# Patient Record
Sex: Female | Born: 1995 | Race: Black or African American | Hispanic: No | Marital: Single | Smoking: Never smoker
Health system: Southern US, Community
[De-identification: ages and names within clinical notes are randomized; demographics above are authoritative.]

## PROBLEM LIST (undated history)

## (undated) DIAGNOSIS — J45909 Unspecified asthma, uncomplicated: Secondary | ICD-10-CM

---

## 2016-08-07 ENCOUNTER — Emergency Department (HOSPITAL_COMMUNITY)
Admission: EM | Admit: 2016-08-07 | Discharge: 2016-08-07 | Disposition: A | Discharge status: Home / Self Care | Payer: Self-pay | Attending: Emergency Medicine | Admitting: Emergency Medicine

## 2016-08-07 ENCOUNTER — Encounter (HOSPITAL_COMMUNITY): Payer: Self-pay | Admitting: Emergency Medicine

## 2016-08-07 DIAGNOSIS — L0291 Cutaneous abscess, unspecified: Secondary | ICD-10-CM

## 2016-08-07 DIAGNOSIS — L02212 Cutaneous abscess of back [any part, except buttock]: Secondary | ICD-10-CM | POA: Insufficient documentation

## 2016-08-07 MED ORDER — DOXYCYCLINE HYCLATE 100 MG PO TABS
100.0000 mg | ORAL_TABLET | Freq: Once | ORAL | Status: AC
  Administered 2016-08-07: 100 mg via ORAL
  Filled 2016-08-07: qty 1

## 2016-08-07 MED ORDER — DOXYCYCLINE HYCLATE 50 MG PO CAPS: 50.0000 mg | ORAL_CAPSULE | Freq: Two times a day (BID) | ORAL | 0 refills | Status: DC

## 2016-08-07 MED ORDER — LIDOCAINE-EPINEPHRINE (PF) 2 %-1:200000 IJ SOLN
10.0000 mL | Freq: Once | INTRAMUSCULAR | Status: AC
  Administered 2016-08-07: 10 mL via INTRADERMAL
  Filled 2016-08-07: qty 20

## 2016-08-07 MED ORDER — HYDROCODONE-ACETAMINOPHEN 5-325 MG PO TABS
1.0000 | ORAL_TABLET | Freq: Once | ORAL | Status: AC
  Administered 2016-08-07: 1 via ORAL
  Filled 2016-08-07: qty 1

## 2016-08-07 MED ORDER — HYDROCODONE-ACETAMINOPHEN 5-325 MG PO TABS: 1.0000 | ORAL_TABLET | Freq: Four times a day (QID) | ORAL | 0 refills | Status: DC | PRN

## 2016-08-07 NOTE — ED Triage Notes (Signed)
Pt presents with abscess with drainage to LEFT middle back; pt states she took ibuprofen, unspecified antibiotics, and hydrocortisone cream without improvement

## 2016-08-07 NOTE — ED Provider Notes (Signed)
[facility]EM[facility]ENCY DEPT Provider Note   CSN: [id number] Arrival date & time: 08/07/16  0117     History   Chief Complaint Chief Complaint  Patient presents with  . Abscess    HPI Renee Martin is a 21 y.o. female.  HPI Renee Martin is a 21 y.o. female presents to emergency department complaining of an abscess to the back. Patient states she noticed a small bump on her back about a week ago. She states she just went to the beach and while at the beach the bump has gotten much larger and painful. She reports some drainage. She denies any treatment prior to coming in. No history of the same. No fever or chills. No associated symptoms.  History reviewed. No pertinent past medical history.  There are no active problems to display for this patient.   History reviewed. No pertinent surgical history.  OB History    No data available       Home Medications    Prior to Admission medications   Not on File    Family History History reviewed. No pertinent family history.  Social History Social History  Substance Use Topics  . Smoking status: Never Smoker  . Smokeless tobacco: Never Used  . Alcohol use No     Allergies   Patient has no known allergies.   Review of Systems Review of Systems  Constitutional: Negative for chills and fever.  Respiratory: Negative for cough, chest tightness and shortness of breath.   Cardiovascular: Negative for chest pain, palpitations and leg swelling.  Gastrointestinal: Negative for abdominal pain, diarrhea, nausea and vomiting.  Musculoskeletal: Negative for arthralgias, myalgias, neck pain and neck stiffness.  Skin: Positive for wound. Negative for rash.  Neurological: Negative for dizziness, weakness and headaches.  All other systems reviewed and are negative.    Physical Exam Updated Vital Signs BP (!) 145/66 (BP Location: Left Arm)   Pulse 84   Temp 98.9 F (37.2 C) (Oral)   Resp (!) 24   LMP 07/30/2016   SpO2 100%    Physical Exam  Constitutional: She appears well-developed and well-nourished. No distress.  HENT:  Head: Normocephalic.  Eyes: Conjunctivae are normal.  Neck: Neck supple.  Cardiovascular: Normal rate, regular rhythm and normal heart sounds.   Pulmonary/Chest: Effort normal and breath sounds normal. No respiratory distress. She has no wheezes. She has no rales.  Musculoskeletal: She exhibits no edema.  Neurological: She is alert.  Skin: Skin is warm and dry.  4 x 4 centimeters area of induration, fluctuance, erythema to the left flank. No drainage. Tender to palpation.  Psychiatric: She has a normal mood and affect. Her behavior is normal.  Nursing note and vitals reviewed.    ED Treatments / Results  Labs (all labs ordered are listed, but only abnormal results are displayed) Labs Reviewed - No data to display  EKG  EKG Interpretation None       Radiology No results found.  Procedures Procedures (including critical care time) INCISION AND DRAINAGE Performed by: Jaynie CrumbleKIRICHENKO, Justin Meisenheimer A Consent: Verbal consent obtained. Risks and benefits: risks, benefits and alternatives were discussed Type: abscess  Body area: right mid back  Anesthesia: local infiltration  Incision was made with a scalpel.  Local anesthetic: lidocaine 2 % w epinephrine  Anesthetic total: 4 ml  Complexity: complex Blunt dissection to break up loculations  Drainage: purulent  Drainage amount: large  Packing material: 1/4 in iodoform gauze  Patient tolerance: Patient tolerated the procedure well with no  immediate complications.    Medications Ordered in ED Medications  lidocaine-EPINEPHrine (XYLOCAINE W/EPI) 2 %-1:200000 (PF) injection 10 mL (not administered)     Initial Impression / Assessment and Plan / ED Course  I have reviewed the triage vital signs and the nursing notes.  Pertinent labs & imaging results that were available during my care of the patient were reviewed by  me and considered in my medical decision making (see chart for details).    Patient with an abscess to the right flank, incised and drained with large purulent drainage. Will discharge home with antibiotics, will start on doxycycline. Will also give pain medications. Advised to come back or follow-up with her doctor in 2 days for recheck and packing removal. Sooner return precautions if worsening rapidly.  Vitals:   08/07/16 0126 08/07/16 0410 08/07/16 0411  BP: (!) 145/66 131/69   Pulse: 84  70  Resp: (!) 24    Temp: 98.9 F (37.2 C)    TempSrc: Oral    SpO2: 100%  99%     Final Clinical Impressions(s) / ED Diagnoses   Final diagnoses:  Abscess    New Prescriptions Discharge Medication List as of 08/07/2016  3:53 AM    START taking these medications   Details  doxycycline (VIBRAMYCIN) 50 MG capsule Take 1 capsule (50 mg total) by mouth 2 (two) times daily., Starting Mon 08/07/2016, Print    HYDROcodone-acetaminophen [facility]) 5-325 MG tablet Take 1 tablet by mouth every 6 (six) hours as needed., Starting Mon 08/07/2016, Print         Trellis Moment Emden, PA-C 08/07/16 0532    Shon Baton, MD 08/09/16 [phone]

## 2016-08-07 NOTE — Discharge Instructions (Signed)
Warm compresses or bath soaks several times a day. Take doxycycline as prescribed until all gone. Take ibuprofen for pain. Norco for severe pain only. Return in 2 days for recheck and packing removal.

## 2016-09-14 ENCOUNTER — Encounter (HOSPITAL_COMMUNITY): Payer: Self-pay

## 2016-09-14 ENCOUNTER — Emergency Department (HOSPITAL_COMMUNITY)
Admission: EM | Admit: 2016-09-14 | Discharge: 2016-09-14 | Disposition: A | Discharge status: Home / Self Care | Payer: Self-pay | Attending: Emergency Medicine | Admitting: Emergency Medicine

## 2016-09-14 ENCOUNTER — Emergency Department (HOSPITAL_COMMUNITY): Payer: Self-pay

## 2016-09-14 DIAGNOSIS — S62613A Displaced fracture of proximal phalanx of left middle finger, initial encounter for closed fracture: Secondary | ICD-10-CM | POA: Insufficient documentation

## 2016-09-14 DIAGNOSIS — J45909 Unspecified asthma, uncomplicated: Secondary | ICD-10-CM | POA: Insufficient documentation

## 2016-09-14 DIAGNOSIS — W228XXA Striking against or struck by other objects, initial encounter: Secondary | ICD-10-CM | POA: Insufficient documentation

## 2016-09-14 DIAGNOSIS — Y999 Unspecified external cause status: Secondary | ICD-10-CM | POA: Insufficient documentation

## 2016-09-14 DIAGNOSIS — Y9289 Other specified places as the place of occurrence of the external cause: Secondary | ICD-10-CM | POA: Insufficient documentation

## 2016-09-14 DIAGNOSIS — Y9389 Activity, other specified: Secondary | ICD-10-CM | POA: Insufficient documentation

## 2016-09-14 HISTORY — DX: Unspecified asthma, uncomplicated: J45.909

## 2016-09-14 MED ORDER — ACETAMINOPHEN 500 MG PO TABS
1000.0000 mg | ORAL_TABLET | Freq: Once | ORAL | Status: AC
  Administered 2016-09-14: 1000 mg via ORAL
  Filled 2016-09-14: qty 2

## 2016-09-14 NOTE — ED Notes (Signed)
Ortho paged and coming to apply splint.
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
## 2016-09-14 NOTE — Discharge Instructions (Addendum)
As we discussed, your finger is broken and will likely need surgical repair. You need to call the hand doctor tomorrow to arrange for an appointment in his office sometime next week.   You can take Tylenol or Ibuprofen as directed for pain.  Keep the splint on until you are seen by the hand surgeon.   Return to the emergency department for any worsening pain, redness/swelling of the finger, fever or any other worsening or concerning symptoms.   If you do not have a primary care doctor you see regularly, please you the list below. Please call them to arrange for follow-up.    No Primary Care Doctor Call [facility]  [phone] Other agencies that provide inexpensive medical care    [facility]  [phone]    [facility]  [phone]    [facility]  [phone]    [facility]  [phone]    [facility]  [phone]    [facility]  [phone]

## 2016-09-14 NOTE — ED Notes (Signed)
Patient at xray

## 2016-09-14 NOTE — ED Notes (Signed)
Ortho at bedside.

## 2016-09-14 NOTE — Progress Notes (Signed)
Orthopedic Tech Progress Note Patient Details:  Renee Martin 11-22-1995 [id number]  Ortho Devices Type of Ortho Device: Finger splint Ortho Device/Splint Location: lue Ortho Device/Splint Interventions: Application   Renee Martin 09/14/2016, 2:27 PM
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
## 2016-09-14 NOTE — ED Notes (Signed)
EDP at bedside  

## 2016-12-22 ENCOUNTER — Emergency Department (HOSPITAL_COMMUNITY): Admission: EM | Admit: 2016-12-22 | Payer: Self-pay | Source: Home / Self Care

## 2016-12-27 ENCOUNTER — Ambulatory Visit (HOSPITAL_COMMUNITY)
Admission: EM | Admit: 2016-12-27 | Discharge: 2016-12-27 | Disposition: A | Discharge status: Home / Self Care | Payer: Self-pay

## 2018-07-18 ENCOUNTER — Emergency Department (HOSPITAL_COMMUNITY)
Admission: EM | Admit: 2018-07-18 | Discharge: 2018-07-19 | Disposition: A | Discharge status: Home / Self Care | Payer: No Typology Code available for payment source | Attending: Emergency Medicine | Admitting: Emergency Medicine

## 2018-07-18 ENCOUNTER — Emergency Department (HOSPITAL_COMMUNITY): Payer: No Typology Code available for payment source

## 2018-07-18 ENCOUNTER — Other Ambulatory Visit: Payer: Self-pay

## 2018-07-18 ENCOUNTER — Encounter (HOSPITAL_COMMUNITY): Payer: Self-pay | Admitting: *Deleted

## 2018-07-18 DIAGNOSIS — R0789 Other chest pain: Secondary | ICD-10-CM | POA: Insufficient documentation

## 2018-07-18 DIAGNOSIS — S01111A Laceration without foreign body of right eyelid and periocular area, initial encounter: Secondary | ICD-10-CM

## 2018-07-18 DIAGNOSIS — Y939 Activity, unspecified: Secondary | ICD-10-CM | POA: Diagnosis not present

## 2018-07-18 DIAGNOSIS — S060X1A Concussion with loss of consciousness of 30 minutes or less, initial encounter: Secondary | ICD-10-CM | POA: Diagnosis not present

## 2018-07-18 DIAGNOSIS — S0083XA Contusion of other part of head, initial encounter: Secondary | ICD-10-CM

## 2018-07-18 DIAGNOSIS — S0285XA Fracture of orbit, unspecified, initial encounter for closed fracture: Secondary | ICD-10-CM

## 2018-07-18 DIAGNOSIS — Y929 Unspecified place or not applicable: Secondary | ICD-10-CM | POA: Insufficient documentation

## 2018-07-18 DIAGNOSIS — T07XXXA Unspecified multiple injuries, initial encounter: Secondary | ICD-10-CM | POA: Diagnosis present

## 2018-07-18 DIAGNOSIS — S062X1A Diffuse traumatic brain injury with loss of consciousness of 30 minutes or less, initial encounter: Secondary | ICD-10-CM

## 2018-07-18 DIAGNOSIS — M549 Dorsalgia, unspecified: Secondary | ICD-10-CM | POA: Diagnosis not present

## 2018-07-18 DIAGNOSIS — Y999 Unspecified external cause status: Secondary | ICD-10-CM | POA: Diagnosis not present

## 2018-07-18 LAB — CBC
HCT: 34.9 % — ABNORMAL LOW (ref 36.0–46.0)
Hemoglobin: 10.8 g/dL — ABNORMAL LOW (ref 12.0–15.0)
MCH: 24.7 pg — ABNORMAL LOW (ref 26.0–34.0)
MCHC: 30.9 g/dL (ref 30.0–36.0)
MCV: 79.7 fL — ABNORMAL LOW (ref 80.0–100.0)
Platelets: 291 10*3/uL (ref 150–400)
RBC: 4.38 MIL/uL (ref 3.87–5.11)
RDW: 15.8 % — ABNORMAL HIGH (ref 11.5–15.5)
WBC: 12.9 10*3/uL — ABNORMAL HIGH (ref 4.0–10.5)
nRBC: 0 % (ref 0.0–0.2)

## 2018-07-18 LAB — BASIC METABOLIC PANEL
Anion gap: 10 (ref 5–15)
BUN: 12 mg/dL (ref 6–20)
CO2: 23 mmol/L (ref 22–32)
Calcium: 8.6 mg/dL — ABNORMAL LOW (ref 8.9–10.3)
Chloride: 109 mmol/L (ref 98–111)
Creatinine, Ser: 0.91 mg/dL (ref 0.44–1.00)
GFR calc Af Amer: >60 mL/min (ref >60)
GFR calc non Af Amer: >60 mL/min (ref >60)
Glucose, Bld: 132 mg/dL — ABNORMAL HIGH (ref 70–99)
Potassium: 3.6 mmol/L (ref 3.5–5.1)
Sodium: 142 mmol/L (ref 135–145)

## 2018-07-18 LAB — I-STAT BETA HCG BLOOD, ED (MC, WL, AP ONLY): I-stat hCG, quantitative: <5 m[IU]/mL (ref <5)

## 2018-07-18 LAB — CBG MONITORING, ED: Glucose-Capillary: 121 mg/dL — ABNORMAL HIGH (ref 70–99)

## 2018-07-18 MED ORDER — HYDROMORPHONE HCL 1 MG/ML IJ SOLN
0.5000 mg | Freq: Once | INTRAMUSCULAR | Status: AC
  Administered 2018-07-18: 0.5 mg via INTRAVENOUS
  Filled 2018-07-18: qty 1

## 2018-07-18 MED ORDER — SODIUM CHLORIDE 0.9 % IV SOLN
INTRAVENOUS | Status: DC
  Administered 2018-07-18: 21:00:00 via INTRAVENOUS

## 2018-07-18 MED ORDER — ONDANSETRON HCL 4 MG/2ML IJ SOLN
4.0000 mg | Freq: Once | INTRAMUSCULAR | Status: AC
  Administered 2018-07-18: 21:00:00 4 mg via INTRAVENOUS
  Filled 2018-07-18: qty 2

## 2018-07-18 MED ORDER — IOHEXOL 300 MG/ML  SOLN
100.0000 mL | Freq: Once | INTRAMUSCULAR | Status: AC | PRN
  Administered 2018-07-18: 100 mL via INTRAVENOUS
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
## 2018-07-18 NOTE — Discharge Instructions (Signed)
Make an appointment to follow-up with ear nose and throat for the right orbital fracture.  Call and set up an appointment.

## 2018-07-19 MED ORDER — ACETAMINOPHEN 500 MG PO TABS: 1000.0000 mg | ORAL_TABLET | Freq: Three times a day (TID) | ORAL | 0 refills | Status: AC

## 2018-07-19 MED ORDER — HYDROCODONE-ACETAMINOPHEN 5-325 MG PO TABS: 1.0000 | ORAL_TABLET | Freq: Three times a day (TID) | ORAL | 0 refills | Status: AC | PRN
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
## 2020-02-25 ENCOUNTER — Emergency Department (HOSPITAL_COMMUNITY): Admission: EM | Admit: 2020-02-25 | Discharge: 2020-02-25 | Discharge status: Against Medical Advice | Payer: Self-pay

## 2020-02-25 ENCOUNTER — Other Ambulatory Visit: Payer: Self-pay

## 2020-02-25 NOTE — ED Notes (Signed)
No answer when called in the ed

## 2020-09-06 IMAGING — CT CT HEAD WITHOUT CONTRAST
3 of 4 series · 13 of 47 positions shown, 15 images · non-contrast
Comparison: None.

CLINICAL DATA: Initial evaluation for acute blunt maxillofacial
trauma.

EXAM:
CT HEAD WITHOUT CONTRAST
CT MAXILLOFACIAL WITHOUT CONTRAST
CT CERVICAL SPINE WITHOUT CONTRAST
TECHNIQUE: Multidetector CT imaging of the head, cervical spine, and
maxillofacial structures were performed using the standard protocol
without intravenous contrast. Multiplanar CT image reconstructions
of the cervical spine and maxillofacial structures were also
generated.

[Series 3: head without · axial · non-contrast · 0.45mm/px · z∈[-80,+50]mm · 7 of 36 slices shown, 9 images]
[im 5/36  brain]
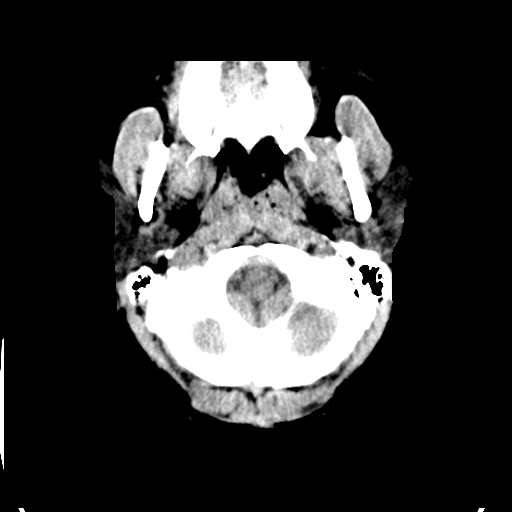
[im 5/36  bone]
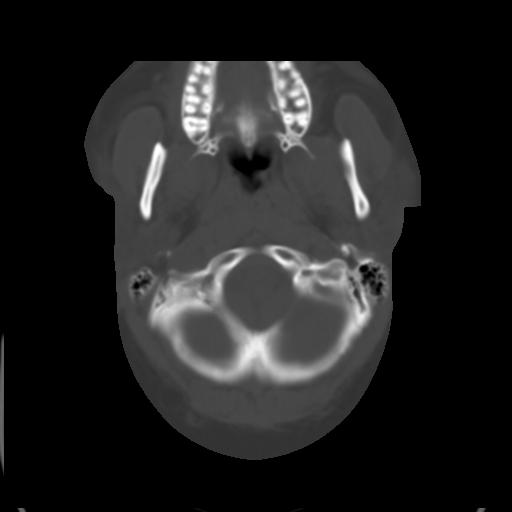
[im 9/36  brain]
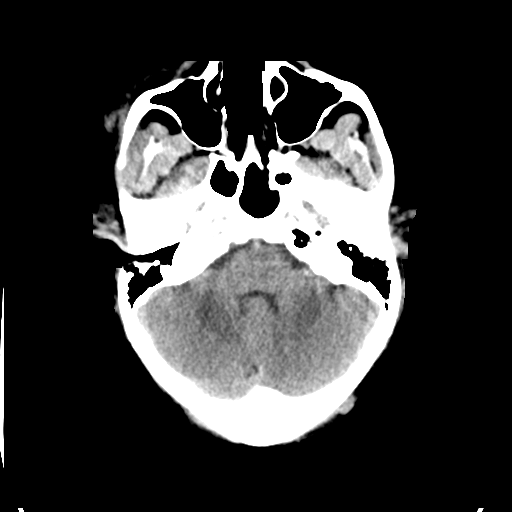
[im 14/36  brain]
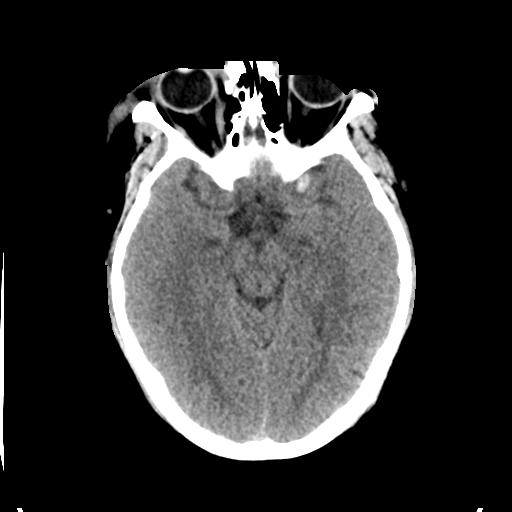
[im 18/36  brain]
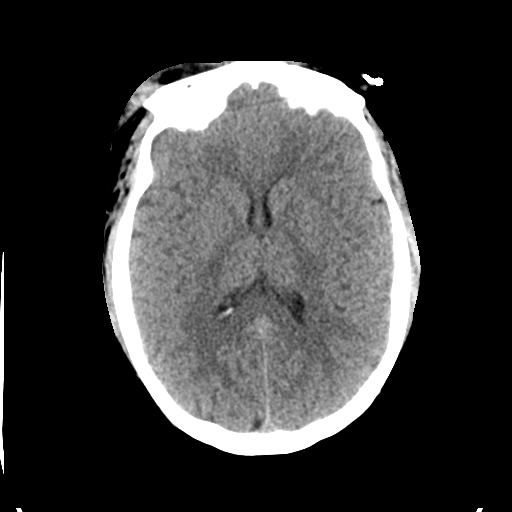
[im 22/36  brain]
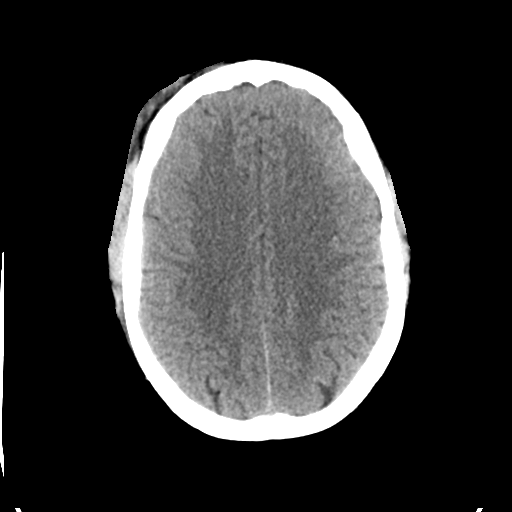
[im 22/36  bone]
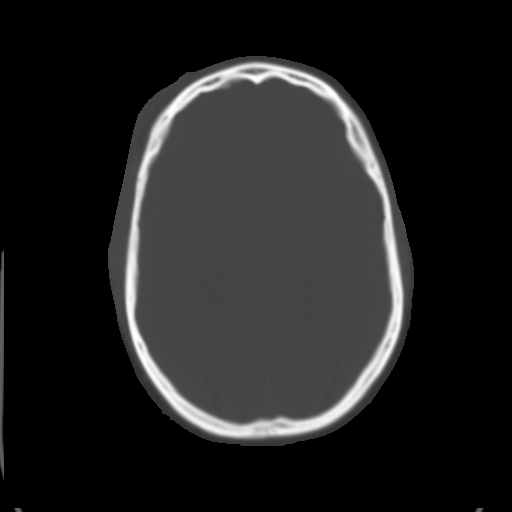
[im 27/36  brain]
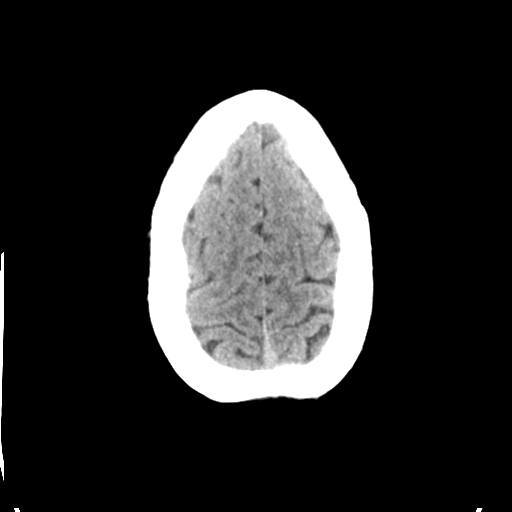
[im 31/36  brain]
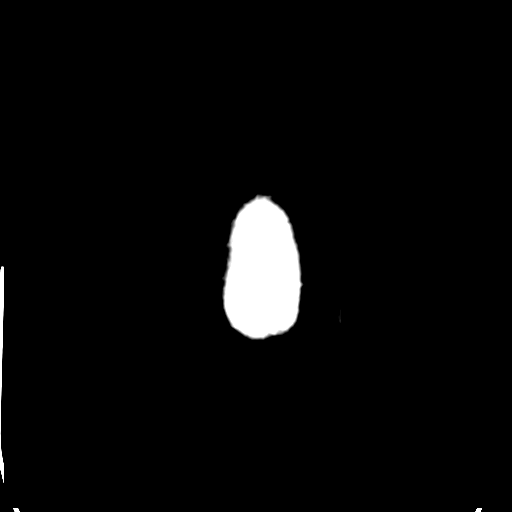

[Series 5: head without cor · coronal · non-contrast · 0.35mm/px · 3 of 74 slices shown]
[im 25/74  brain]
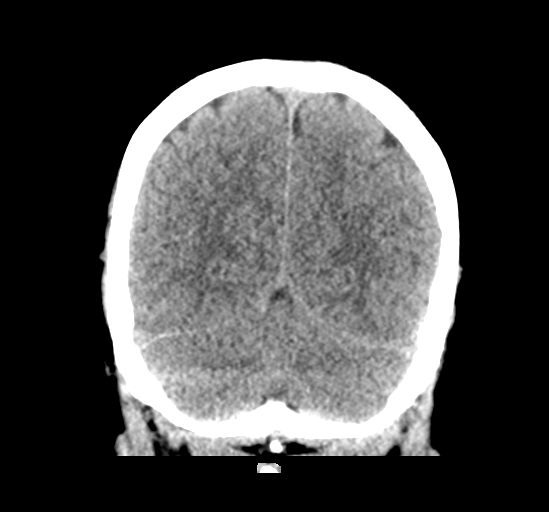
[im 33/74  brain]
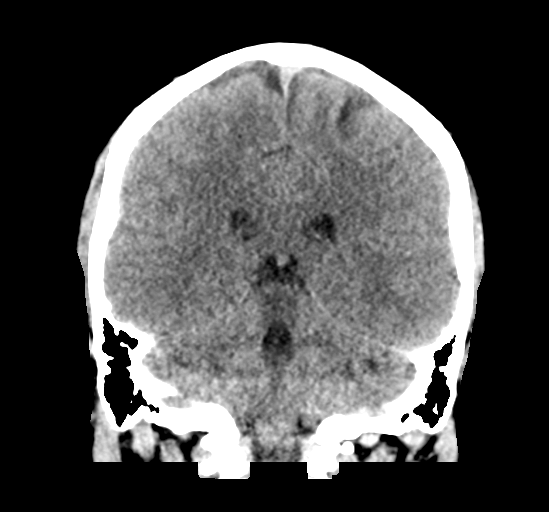
[im 41/74  brain]
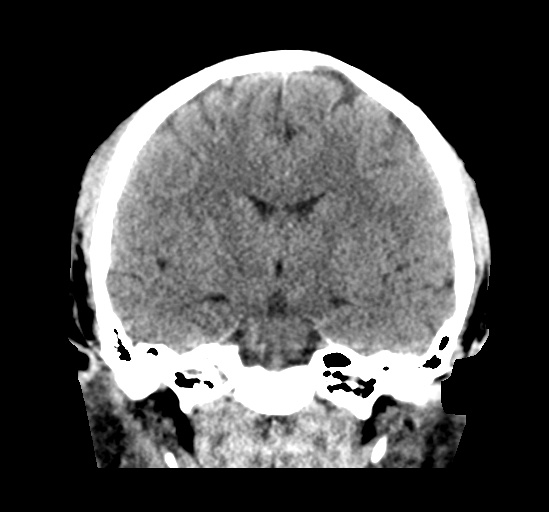

[Series 6: head without sag · sagittal · non-contrast · 0.35mm/px · 3 of 60 slices shown]
[im 20/60  brain]
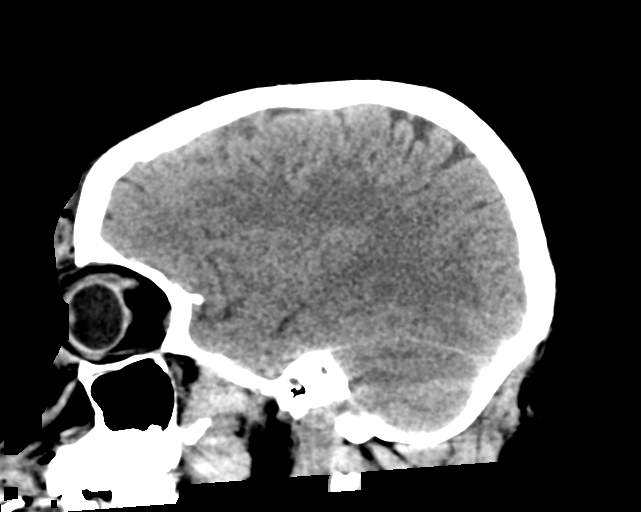
[im 30/60  brain]
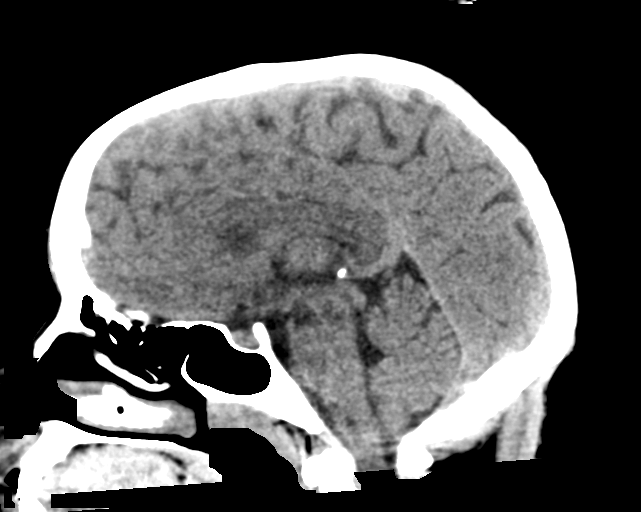
[im 40/60  brain]
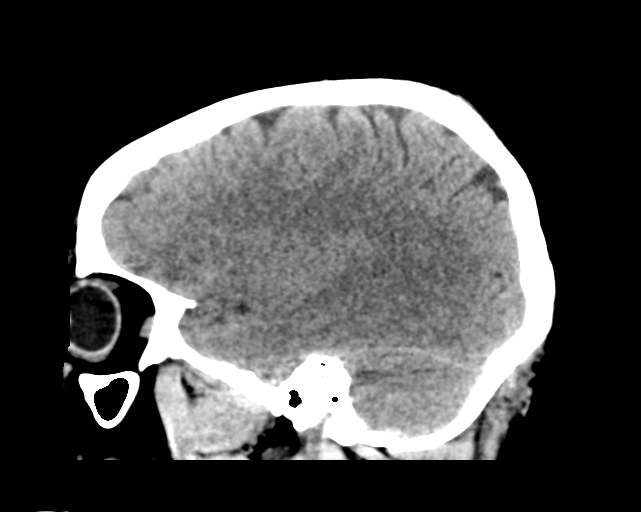

[13 of 47 positions shown; findings below may reference images not displayed]

FINDINGS: CT HEAD FINDINGS

Brain: Small 1 cm hemorrhagic contusion seen involving the para
clinoid aspect of the anteromedial left temporal lobe (series 3,
image 14). No significant mass effect. No other acute intracranial
hemorrhage. No acute large vessel territory infarct. No mass lesion
or midline shift. No hydrocephalus. No extra-axial fluid collection.

Vascular: No hyperdense vessel.

Skull: Large soft tissue hematoma at the right forehead/periorbital
region. No calvarial fracture.

Other: Mastoid air cells are clear.

CT MAXILLOFACIAL FINDINGS

Osseous: Zygomatic arches are intact. No acute maxillary fracture.
Pterygoid plates intact. Nasal bones intact. Mild right-to-left
nasal septal deviation without fracture. Mandible intact. Mandibular
condyles normally situated. Few scattered dental caries and
periapical lucencies noted.

Orbits: There is an acute fracture of the right lamina papyracea
with medial displacement (series 4, image 64). Portion of the
extraconal intraorbital fat is herniated through the fracture
defect. Right medial rectus muscle closely approximates the fracture
defect but remains positioned within the bony right orbit.
Associated few scattered foci of soft tissue emphysema within the
medial right orbit. No frank intraorbital hematoma. Right globe
intact. Right orbital floor and remainder of the bony orbit intact.
No abnormality about the left globe and orbit.

Sinuses: Scattered mucosal thickening with probable small amount of
blood products within the right ethmoidal air cells related to the
adjacent fracture. Paranasal sinuses are otherwise clear.

Soft tissues: Large right periorbital soft tissue
contusion/laceration. Few scattered foci of soft tissue emphysema.

CT CERVICAL SPINE FINDINGS

Alignment: Straightening of the normal cervical lordosis. No
listhesis or malalignment.

Skull base and vertebrae: Skull base intact. Normal C1-2
articulations are preserved in the dens is intact. Vertebral body
heights maintained. No acute fracture.

Soft tissues and spinal canal: Soft tissues of the neck demonstrate
no acute finding. No abnormal prevertebral edema. Spinal canal
within normal limits.

Disc levels: No significant disc pathology within the cervical
spine.

Upper chest: Visualized upper chest demonstrates no acute finding.

Other: None.
IMPRESSION: CT HEAD:

1. 1 cm hemorrhagic contusion involving the paraclinoid aspect of
the anteromedial left temporal lobe. No associated mass effect.
2. No other acute intracranial abnormality.
3. Large right frontal scalp/periorbital contusion with laceration.
No calvarial fracture.

CT MAXILLOFACIAL:

1. Acute fracture involving the right lamina papyracea with medial
displacement. Right medial rectus muscle closely approximates the
fracture defect but remains position within the bony right orbit. No
retro-orbital hematoma. Intact globes.
2. Large right periorbital soft tissue contusion/laceration.

CT CERVICAL SPINE:

No acute traumatic injury within the cervical spine.

Critical Value/emergent results were called by telephone at the time
of interpretation on 07/18/2018 at [DATE] to Dr. ANCHAL BOLDEN ,
who verbally acknowledged these results.

## 2021-12-22 ENCOUNTER — Other Ambulatory Visit: Payer: Self-pay

## 2021-12-22 ENCOUNTER — Emergency Department (HOSPITAL_COMMUNITY)
Admission: EM | Admit: 2021-12-22 | Discharge: 2021-12-22 | Disposition: A | Discharge status: Home / Self Care | Payer: Self-pay | Attending: Emergency Medicine | Admitting: Emergency Medicine

## 2021-12-22 ENCOUNTER — Emergency Department (HOSPITAL_COMMUNITY): Payer: Self-pay

## 2021-12-22 DIAGNOSIS — N390 Urinary tract infection, site not specified: Secondary | ICD-10-CM | POA: Insufficient documentation

## 2021-12-22 DIAGNOSIS — J45909 Unspecified asthma, uncomplicated: Secondary | ICD-10-CM | POA: Insufficient documentation

## 2021-12-22 DIAGNOSIS — M545 Low back pain, unspecified: Secondary | ICD-10-CM

## 2021-12-22 DIAGNOSIS — M5136 Other intervertebral disc degeneration, lumbar region: Secondary | ICD-10-CM | POA: Insufficient documentation

## 2021-12-22 LAB — CBC
HCT: 38.1 % (ref 36.0–46.0)
Hemoglobin: 12.4 g/dL (ref 12.0–15.0)
MCH: 26.7 pg (ref 26.0–34.0)
MCHC: 32.5 g/dL (ref 30.0–36.0)
MCV: 82.1 fL (ref 80.0–100.0)
Platelets: 281 10*3/uL (ref 150–400)
RBC: 4.64 MIL/uL (ref 3.87–5.11)
RDW: 15.2 % (ref 11.5–15.5)
WBC: 10.1 10*3/uL (ref 4.0–10.5)
nRBC: 0 % (ref 0.0–0.2)

## 2021-12-22 LAB — BASIC METABOLIC PANEL
Anion gap: 6 (ref 5–15)
BUN: 12 mg/dL (ref 6–20)
CO2: 25 mmol/L (ref 22–32)
Calcium: 9 mg/dL (ref 8.9–10.3)
Chloride: 109 mmol/L (ref 98–111)
Creatinine, Ser: 0.89 mg/dL (ref 0.44–1.00)
GFR, Estimated: >60 mL/min (ref >60)
Glucose, Bld: 93 mg/dL (ref 70–99)
Potassium: 4 mmol/L (ref 3.5–5.1)
Sodium: 140 mmol/L (ref 135–145)

## 2021-12-22 LAB — URINALYSIS, ROUTINE W REFLEX MICROSCOPIC
Bilirubin Urine: NEGATIVE
Glucose, UA: NEGATIVE mg/dL
Ketones, ur: NEGATIVE mg/dL
Nitrite: NEGATIVE
Protein, ur: NEGATIVE mg/dL
Specific Gravity, Urine: 1.021 (ref 1.005–1.030)
pH: 8 (ref 5.0–8.0)

## 2021-12-22 LAB — PREGNANCY, URINE: Preg Test, Ur: NEGATIVE

## 2021-12-22 MED ORDER — CEPHALEXIN 250 MG PO CAPS
500.0000 mg | ORAL_CAPSULE | Freq: Once | ORAL | Status: AC
  Administered 2021-12-22: 500 mg via ORAL
  Filled 2021-12-22: qty 2

## 2021-12-22 MED ORDER — CEPHALEXIN 500 MG PO CAPS: 500.0000 mg | ORAL_CAPSULE | Freq: Two times a day (BID) | ORAL | 0 refills | Status: AC

## 2021-12-22 MED ORDER — CYCLOBENZAPRINE HCL 10 MG PO TABS
5.0000 mg | ORAL_TABLET | Freq: Once | ORAL | Status: AC
  Administered 2021-12-22: 5 mg via ORAL
  Filled 2021-12-22: qty 1

## 2021-12-22 MED ORDER — LIDOCAINE 5 % EX PTCH
1.0000 | MEDICATED_PATCH | CUTANEOUS | Status: DC
  Administered 2021-12-22: 1 via TRANSDERMAL
  Filled 2021-12-22: qty 1

## 2021-12-22 MED ORDER — CYCLOBENZAPRINE HCL 10 MG PO TABS: 5.0000 mg | ORAL_TABLET | Freq: Three times a day (TID) | ORAL | 0 refills | Status: DC | PRN

## 2021-12-22 MED ORDER — NAPROXEN 500 MG PO TABS: 500.0000 mg | ORAL_TABLET | Freq: Two times a day (BID) | ORAL | 0 refills | Status: DC

## 2021-12-22 MED ORDER — KETOROLAC TROMETHAMINE 60 MG/2ML IM SOLN
60.0000 mg | Freq: Once | INTRAMUSCULAR | Status: AC
  Administered 2021-12-22: 60 mg via INTRAMUSCULAR
  Filled 2021-12-22: qty 2

## 2021-12-22 MED ORDER — LIDOCAINE 5 % EX PTCH: 1.0000 | MEDICATED_PATCH | CUTANEOUS | 0 refills | Status: DC
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
## 2021-12-22 NOTE — ED Triage Notes (Signed)
Back pain x1 week. Hurts on any movement. Lower back pain. Tried icy hot with little relief.

## 2021-12-22 NOTE — ED Provider Triage Note (Signed)
Emergency Medicine Provider Triage Evaluation Note  Tansy Lorek , a 26 y.o. female  was evaluated in triage.  Pt complains of low back pain x4 days.  Patient denies any preceding trauma to account for this pain.  The patient states that she has tried taking IcyHot without relief.  The patient denies any red flag symptoms of low back pain.  Patient denies any heavy lifting.  Patient states that pain is worsened with movement, alleviated with rest.  Patient denies any nausea, vomiting, fever.  Review of Systems  Positive:  Negative:   Physical Exam  BP 118/70 (BP Location: Right Arm)   Pulse (!) 54   Temp 98.2 F (36.8 C) (Oral)   Resp 18   SpO2 99%  Gen:   Awake, no distress   Resp:  Normal effort  MSK:   Moves extremities without difficulty  Other:  No centralized spinal tenderness.  No deformity or step-off.  Medical Decision Making  Medically screening exam initiated at 3:07 PM.  Appropriate orders placed.  Merilyn Pagan was informed that the remainder of the evaluation will be completed by another provider, this initial triage assessment does not replace that evaluation, and the importance of remaining in the ED until their evaluation is complete.     Azucena Cecil, PA-C 12/22/21 [phone]

## 2021-12-22 NOTE — Discharge Instructions (Addendum)
You were seen for back pain today.  Your x-ray showed no fracture or dislocation but does show evidence of degenerative disc disease which is likely contributing to your back pain.  Continue to do gentle exercises and take the naproxen, Flexeril and Tylenol for pain control.  You can also place lidocaine patches on the area that is causing pain.  Additionally, you do have a urinary tract infection which could be contributing to pain.  Take the antibiotics as prescribed.  You can make an appointment with orthopedics for an outpatient follow-up if pain continues over the next 1 to 2 weeks or worsens.  Come back for any severe worsening pain, loss of sensation in the groin, losing control of your bladder or bowels, new weakness in legs, or any other symptoms concerning to you.

## 2022-12-12 ENCOUNTER — Emergency Department (HOSPITAL_COMMUNITY)
Admission: EM | Admit: 2022-12-12 | Discharge: 2022-12-12 | Discharge status: Against Medical Advice | Payer: 59 | Attending: Emergency Medicine | Admitting: Emergency Medicine

## 2022-12-12 ENCOUNTER — Encounter (HOSPITAL_COMMUNITY): Payer: Self-pay | Admitting: Emergency Medicine

## 2022-12-12 DIAGNOSIS — Z5321 Procedure and treatment not carried out due to patient leaving prior to being seen by health care provider: Secondary | ICD-10-CM | POA: Insufficient documentation

## 2022-12-12 DIAGNOSIS — R1084 Generalized abdominal pain: Secondary | ICD-10-CM | POA: Diagnosis present

## 2022-12-12 DIAGNOSIS — R1110 Vomiting, unspecified: Secondary | ICD-10-CM | POA: Diagnosis not present

## 2022-12-12 LAB — CBC WITH DIFFERENTIAL/PLATELET
Abs Immature Granulocytes: 0.04 10*3/uL (ref 0.00–0.07)
Basophils Absolute: 0.1 10*3/uL (ref 0.0–0.1)
Basophils Relative: 0 %
Eosinophils Absolute: 0.2 10*3/uL (ref 0.0–0.5)
Eosinophils Relative: 1 %
HCT: 40.7 % (ref 36.0–46.0)
Hemoglobin: 12.9 g/dL (ref 12.0–15.0)
Immature Granulocytes: 0 %
Lymphocytes Relative: 21 %
Lymphs Abs: 2.7 10*3/uL (ref 0.7–4.0)
MCH: 26.2 pg (ref 26.0–34.0)
MCHC: 31.7 g/dL (ref 30.0–36.0)
MCV: 82.6 fL (ref 80.0–100.0)
Monocytes Absolute: 0.7 10*3/uL (ref 0.1–1.0)
Monocytes Relative: 5 %
Neutro Abs: 9.5 10*3/uL — ABNORMAL HIGH (ref 1.7–7.7)
Neutrophils Relative %: 73 %
Platelets: 263 10*3/uL (ref 150–400)
RBC: 4.93 MIL/uL (ref 3.87–5.11)
RDW: 14.6 % (ref 11.5–15.5)
WBC: 13.2 10*3/uL — ABNORMAL HIGH (ref 4.0–10.5)
nRBC: 0 % (ref 0.0–0.2)

## 2022-12-12 LAB — COMPREHENSIVE METABOLIC PANEL
ALT: 15 U/L (ref 0–44)
AST: 17 U/L (ref 15–41)
Albumin: 3.8 g/dL (ref 3.5–5.0)
Alkaline Phosphatase: 69 U/L (ref 38–126)
Anion gap: 7 (ref 5–15)
BUN: 13 mg/dL (ref 6–20)
CO2: 26 mmol/L (ref 22–32)
Calcium: 8.6 mg/dL — ABNORMAL LOW (ref 8.9–10.3)
Chloride: 103 mmol/L (ref 98–111)
Creatinine, Ser: 0.88 mg/dL (ref 0.44–1.00)
GFR, Estimated: >60 mL/min (ref >60)
Glucose, Bld: 111 mg/dL — ABNORMAL HIGH (ref 70–99)
Potassium: 3.7 mmol/L (ref 3.5–5.1)
Sodium: 136 mmol/L (ref 135–145)
Total Bilirubin: 0.3 mg/dL (ref 0.3–1.2)
Total Protein: 7.5 g/dL (ref 6.5–8.1)

## 2022-12-12 LAB — URINALYSIS, ROUTINE W REFLEX MICROSCOPIC
Bilirubin Urine: NEGATIVE
Glucose, UA: NEGATIVE mg/dL
Hgb urine dipstick: NEGATIVE
Ketones, ur: NEGATIVE mg/dL
Nitrite: NEGATIVE
Protein, ur: 30 mg/dL — AB
Specific Gravity, Urine: 1.028 (ref 1.005–1.030)
pH: 6 (ref 5.0–8.0)

## 2022-12-12 LAB — POC URINE PREG, ED: Preg Test, Ur: NEGATIVE

## 2022-12-12 LAB — LIPASE, BLOOD: Lipase: 21 U/L (ref 11–51)

## 2022-12-12 NOTE — ED Triage Notes (Signed)
Pt reporting generalized ABD pain starting this morning. Vomited x 4. Pt reports she has unprotected sex and possible she is pregnant.

## 2023-12-05 ENCOUNTER — Emergency Department (HOSPITAL_COMMUNITY)
Admission: EM | Admit: 2023-12-05 | Discharge: 2023-12-05 | Disposition: A | Discharge status: Home / Self Care | Payer: Self-pay | Attending: Emergency Medicine | Admitting: Emergency Medicine

## 2023-12-05 ENCOUNTER — Encounter (HOSPITAL_COMMUNITY): Payer: Self-pay | Admitting: *Deleted

## 2023-12-05 ENCOUNTER — Other Ambulatory Visit: Payer: Self-pay

## 2023-12-05 DIAGNOSIS — M545 Low back pain, unspecified: Secondary | ICD-10-CM | POA: Insufficient documentation

## 2023-12-05 MED ORDER — LIDOCAINE 5 % EX PTCH: 1.0000 | MEDICATED_PATCH | CUTANEOUS | 0 refills | Status: AC

## 2023-12-05 MED ORDER — CYCLOBENZAPRINE HCL 10 MG PO TABS: 10.0000 mg | ORAL_TABLET | Freq: Three times a day (TID) | ORAL | 0 refills | Status: AC | PRN

## 2023-12-05 MED ORDER — LIDOCAINE 5 % EX PTCH
1.0000 | MEDICATED_PATCH | CUTANEOUS | Status: DC
  Administered 2023-12-05: 1 via TRANSDERMAL
  Filled 2023-12-05: qty 1

## 2023-12-05 MED ORDER — NAPROXEN 500 MG PO TABS: 500.0000 mg | ORAL_TABLET | Freq: Two times a day (BID) | ORAL | 0 refills | Status: AC
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
## 2023-12-05 NOTE — Discharge Instructions (Signed)
 Apply lidocaine  patch to area of pain. Take Flexeril  as directed as needed for muscle spasm. Do not drive if taking this medication. Take Naproxen  as prescribed as needed for pain.  Follow up with [facility].
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
## 2024-03-11 ENCOUNTER — Emergency Department (HOSPITAL_COMMUNITY)
Admission: EM | Admit: 2024-03-11 | Discharge: 2024-03-12 | Disposition: A | Discharge status: Home / Self Care | Payer: Self-pay | Attending: Emergency Medicine | Admitting: Emergency Medicine

## 2024-03-11 ENCOUNTER — Other Ambulatory Visit: Payer: Self-pay

## 2024-03-11 ENCOUNTER — Encounter (HOSPITAL_COMMUNITY): Payer: Self-pay | Admitting: Emergency Medicine

## 2024-03-11 DIAGNOSIS — J101 Influenza due to other identified influenza virus with other respiratory manifestations: Secondary | ICD-10-CM | POA: Insufficient documentation

## 2024-03-11 DIAGNOSIS — Z5321 Procedure and treatment not carried out due to patient leaving prior to being seen by health care provider: Secondary | ICD-10-CM | POA: Insufficient documentation

## 2024-03-11 LAB — RESP PANEL BY RT-PCR (RSV, FLU A&B, COVID)  RVPGX2
Influenza A by PCR: POSITIVE — AB
Influenza B by PCR: NEGATIVE
Resp Syncytial Virus by PCR: NEGATIVE
SARS Coronavirus 2 by RT PCR: NEGATIVE

## 2024-03-11 MED ORDER — ACETAMINOPHEN 500 MG PO TABS
1000.0000 mg | ORAL_TABLET | Freq: Once | ORAL | Status: AC
  Administered 2024-03-11: 1000 mg via ORAL
  Filled 2024-03-11: qty 2

## 2024-03-11 NOTE — ED Triage Notes (Signed)
 Pt has been having cough and cold.

## 2024-03-11 NOTE — ED Provider Triage Note (Signed)
 Emergency Medicine Provider Triage Evaluation Note  Renee Martin , a 28 y.o. female  was evaluated in triage.  Pt complains of flulike symptoms that started last night.  Have not improved despite taking ibuprofen..  No shortness of breath, chest pain.  Endorses cough  Review of Systems  Positive: As above Negative: As above  Physical Exam  There were no vitals taken for this visit. Gen:   Awake, no distress   Resp:  Normal effort  MSK:   Moves extremities without difficulty  Other:    Medical Decision Making  Medically screening exam initiated at 7:48 PM.  Appropriate orders placed.  Shadai Mcclane was informed that the remainder of the evaluation will be completed by another provider, this initial triage assessment does not replace that evaluation, and the importance of remaining in the ED until their evaluation is complete.    Hildegard Loge, PA-C 03/11/24 [id number]

## 2024-03-12 NOTE — ED Notes (Signed)
 Pt reports leaving
